# Patient Record
Sex: Female | Born: 1995 | Race: Black or African American | Hispanic: No | Marital: Single | Smoking: Never smoker
Health system: Southern US, Community
[De-identification: ages and names within clinical notes are randomized; demographics above are authoritative.]

---

## 2016-01-27 ENCOUNTER — Ambulatory Visit: Admission: EM | Admit: 2016-01-27 | Discharge: 2016-01-27 | Discharge status: Absent without leave | Payer: Self-pay

## 2016-02-27 ENCOUNTER — Encounter: Payer: Self-pay | Admitting: Emergency Medicine

## 2016-02-27 ENCOUNTER — Encounter: Payer: Self-pay | Admitting: Gynecology

## 2016-02-27 ENCOUNTER — Ambulatory Visit
Admission: EM | Admit: 2016-02-27 | Discharge: 2016-02-27 | Disposition: A | Discharge status: Home / Self Care | Payer: Self-pay | Attending: Family Medicine | Admitting: Family Medicine

## 2016-02-27 ENCOUNTER — Emergency Department
Admission: EM | Admit: 2016-02-27 | Discharge: 2016-02-27 | Disposition: A | Discharge status: Home / Self Care | Payer: Self-pay | Attending: Emergency Medicine | Admitting: Emergency Medicine

## 2016-02-27 ENCOUNTER — Emergency Department: Payer: Self-pay

## 2016-02-27 DIAGNOSIS — R55 Syncope and collapse: Secondary | ICD-10-CM | POA: Insufficient documentation

## 2016-02-27 DIAGNOSIS — R079 Chest pain, unspecified: Secondary | ICD-10-CM | POA: Insufficient documentation

## 2016-02-27 DIAGNOSIS — R109 Unspecified abdominal pain: Secondary | ICD-10-CM | POA: Insufficient documentation

## 2016-02-27 DIAGNOSIS — R1010 Upper abdominal pain, unspecified: Secondary | ICD-10-CM

## 2016-02-27 DIAGNOSIS — R0789 Other chest pain: Secondary | ICD-10-CM | POA: Insufficient documentation

## 2016-02-27 LAB — URINALYSIS, COMPLETE (UACMP) WITH MICROSCOPIC
BILIRUBIN URINE: NEGATIVE
Bacteria, UA: NONE SEEN
GLUCOSE, UA: NEGATIVE mg/dL
HGB URINE DIPSTICK: NEGATIVE
KETONES UR: NEGATIVE mg/dL
Nitrite: NEGATIVE
PH: 6 (ref 5.0–8.0)
PROTEIN: NEGATIVE mg/dL
Specific Gravity, Urine: 1.025 (ref 1.005–1.030)

## 2016-02-27 LAB — BASIC METABOLIC PANEL
ANION GAP: 10 (ref 5–15)
BUN: 11 mg/dL (ref 6–20)
CALCIUM: 10 mg/dL (ref 8.9–10.3)
CO2: 23 mmol/L (ref 22–32)
CREATININE: 0.78 mg/dL (ref 0.44–1.00)
Chloride: 105 mmol/L (ref 101–111)
GFR calc Af Amer: >60 mL/min (ref >60)
GLUCOSE: 78 mg/dL (ref 65–99)
Potassium: 3.7 mmol/L (ref 3.5–5.1)
Sodium: 138 mmol/L (ref 135–145)

## 2016-02-27 LAB — TROPONIN I

## 2016-02-27 LAB — CBC
HCT: 43.3 % (ref 35.0–47.0)
HEMOGLOBIN: 14 g/dL (ref 12.0–16.0)
MCH: 25.5 pg — AB (ref 26.0–34.0)
MCHC: 32.3 g/dL (ref 32.0–36.0)
MCV: 78.9 fL — ABNORMAL LOW (ref 80.0–100.0)
Platelets: 243 10*3/uL (ref 150–440)
RBC: 5.49 MIL/uL — ABNORMAL HIGH (ref 3.80–5.20)
RDW: 14.6 % — AB (ref 11.5–14.5)
WBC: 7.9 10*3/uL (ref 3.6–11.0)

## 2016-02-27 LAB — FIBRIN DERIVATIVES D-DIMER (ARMC ONLY): Fibrin derivatives D-dimer (ARMC): 188 (ref 0–499)

## 2016-02-27 NOTE — ED Notes (Signed)
Spoke to Leisure centre managerBrandy charge nurse at [facility] and gave her report.

## 2016-02-27 NOTE — Discharge Instructions (Signed)

## 2016-02-27 NOTE — ED Triage Notes (Signed)
Pt states chest pain has been present since Christmas, pt states she had a syncopal episode at work on Wednesday which she states she felt dizzy before passing out.  Pain is in the central chest radiating to the left side and also radiating to the back.  Pt states she gets short of breath at rest at times as well.  No recent extend travel.

## 2016-02-27 NOTE — ED Provider Notes (Signed)
[facility] Emergency Department Provider Note  ____________________________________________   First MD Initiated Contact with Patient 02/27/16 1826     (approximate)  I have reviewed the triage vital signs and the nursing notes.   HISTORY  Chief Complaint Chest Pain    HPI [name] is a 21 y.o. female who reports no chronic medical problems and presents for evaluation of pressure like chest pain that has been present for about 2 months.  She states that nothing in particular makes it better or worse but it is more noticeable and seems to be worse at night.  It is currently mild.  Sometimes she feels short of breath but not always.  She decided to come to the emergency department today because 3 days ago she had a syncopal episode while at work.  She states that she started feeling lightheaded and dizzy and got very hot all over and new she was going to pass out.  She did not sustain any injuries and she is not sure if she had chest pain at this time.  This is the first opportunity she has had to follow up since the syncopal episode.  She denies fever/chills nausea, vomiting, diarrhea, abdominal pain, dysuria.  She is not on any birth control pills or estrogen supplements.  She has not been on any long trips recently and has had no immobilizations.  There is no history, personal or family, of blood clots, MI, nor sudden cardiac death in otherwise young and healthy individuals.    She went to the urgent care earlier today and then was referred to the emergency department.  History reviewed. No pertinent past medical history.  There are no active problems to display for this patient.   History reviewed. No pertinent surgical history.  Prior to Admission medications   Not on File    Allergies Patient has no known allergies.  History reviewed. No pertinent family history.  Social History Social History  Substance Use Topics  . Smoking  status: Never Smoker  . Smokeless tobacco: Never Used  . Alcohol use No    Review of Systems Constitutional: No fever/chills Eyes: No visual changes. ENT: No sore throat. Cardiovascular: Pressure like chest pain for 2 months Respiratory: Denies shortness of breath. Gastrointestinal: No abdominal pain.  No nausea, no vomiting.  No diarrhea.  No constipation. Genitourinary: Negative for dysuria. Musculoskeletal: Negative for back pain. Skin: Negative for rash. Neurological: Negative for headaches, focal weakness or numbness.  10-point ROS otherwise negative.  ____________________________________________   PHYSICAL EXAM:  VITAL SIGNS: ED Triage Vitals  Enc Vitals Group     BP 02/27/16 1715 129/72     Pulse Rate 02/27/16 1715 80     Resp 02/27/16 1715 18     Temp 02/27/16 1715 98 F (36.7 C)     Temp Source 02/27/16 1715 Oral     SpO2 02/27/16 1715 100 %     Weight 02/27/16 1711 132 lb (59.9 kg)     Height 02/27/16 1711 5\' 6"  (1.676 m)     Head Circumference --      Peak Flow --      Pain Score 02/27/16 1712 7     Pain Loc --      Pain Edu? --      Excl. in GC? --     Constitutional: Alert and oriented. Well appearing and in no acute distress. Eyes: Conjunctivae are normal. PERRL. EOMI. Head: Atraumatic. Nose: No congestion/rhinnorhea. Mouth/Throat: Mucous  membranes are moist. Neck: No stridor.  No meningeal signs.   Cardiovascular: Normal rate, regular rhythm. Good peripheral circulation. Grossly normal heart sounds. Respiratory: Normal respiratory effort.  No retractions. Lungs CTAB. Gastrointestinal: Soft and nontender. No distention.  Musculoskeletal: No lower extremity tenderness nor edema. No gross deformities of extremities. Neurologic:  Normal speech and language. No gross focal neurologic deficits are appreciated.  Skin:  Skin is warm, dry and intact. No rash noted. Psychiatric: Mood and affect are normal. Speech and behavior are  normal.  ____________________________________________   LABS (all labs ordered are listed, but only abnormal results are displayed)  Labs Reviewed  CBC - Abnormal; Notable for the following:       Result Value   RBC 5.49 (*)    MCV 78.9 (*)    MCH 25.5 (*)    RDW 14.6 (*)    All other components within normal limits  BASIC METABOLIC PANEL  TROPONIN I  FIBRIN DERIVATIVES D-DIMER ([facility] ONLY)   ____________________________________________  EKG  ED ECG REPORT I, Luisdavid Hamblin, the attending physician, personally viewed and interpreted this ECG.  Date: 02/27/2016 EKG Time: 17:06 Rate: 87 Rhythm: normal sinus rhythm QRS Axis: normal Intervals: normal ST/T Wave abnormalities: normal Conduction Disturbances: none Narrative Interpretation: unremarkable  ____________________________________________  RADIOLOGY   Dg Chest 2 View  Result Date: 02/27/2016 CLINICAL DATA:  Chest pain EXAM: CHEST  2 VIEW COMPARISON:  None. FINDINGS: The heart size and mediastinal contours are within normal limits. Both lungs are clear. The visualized skeletal structures are unremarkable. IMPRESSION: No active cardiopulmonary disease. Electronically Signed   By: Gerome Sam III M.D   On: 02/27/2016 17:41    ____________________________________________   PROCEDURES  Procedure(s) performed:   Procedures   Critical Care performed: No ____________________________________________   INITIAL IMPRESSION / ASSESSMENT AND PLAN / ED COURSE  Pertinent labs & imaging results that were available during my care of the patient were reviewed by me and considered in my medical decision making (see chart for details).  Patient is well-appearing and in no acute distress with a normal EKG.  Her troponin and CBC and BMP are all within normal limits as is her chest x-ray.  There is no indication of hypertrophic cardiomyopathy based on her history nor her EKG.  She is low risk for PE but I do not have a  better explanation for the constellation of symptoms including persistent chest pain, intermittent shortness of breath, and even a syncopal episode although the syncope sounded more orthostatic or vasovagal than cardiogenic.  Since she is low risk, I will obtain a d-dimer to try and rule out PE.  If it is not within normal limits I will obtain a CT scan of her chest.  I discussed all this with the patient and she understands and agrees with the plan.   Clinical Course as of Feb 27 2040  Sat Feb 27, 2016  4782 D-dimer is within normal limits.  I will update the patient and let her know that her workup was reassuring with no evidence of acute or emergent cause of her ongoing chest pain for 2 months.  I will give her the name and number with cardiologist whom she can follow-up. I gave my usual and customary return precautions.  Fibrin derivatives D-dimer [facility]): 188 [CF]    Clinical Course User Index [CF] Loleta Rose, MD    ____________________________________________  FINAL CLINICAL IMPRESSION(S) / ED DIAGNOSES  Final diagnoses:  Atypical chest pain  MEDICATIONS GIVEN DURING THIS VISIT:  Medications - No data to display   NEW OUTPATIENT MEDICATIONS STARTED DURING THIS VISIT:  New Prescriptions   No medications on file    Modified Medications   No medications on file    Discontinued Medications   No medications on file     Note:  This document was prepared using Dragon voice recognition software and may include unintentional dictation errors.    Loleta Roseory Susette Seminara, MD 02/27/16 2042

## 2016-02-27 NOTE — ED Triage Notes (Signed)
Per patient x couple weeks chest pain rotating to her back. Patient also complain of lower abdominal pain. Per patient pass out at work x 5 days ago.

## 2016-02-27 NOTE — ED Notes (Signed)
NAD noted at this time. Pt resting in bed with eyes open, respirations even and unlabored, skin warm, dry, and intact. Pt requesting something to eat, this RN explained would have to notify MD. Pt states understanding.

## 2016-02-27 NOTE — ED Notes (Signed)
Pt visualized in NAD at this time. PT resting in bed. Denies any needs to this RN at this time. Pt skin noted to be warm, dry, and intact, respirations even and unlabored at this time. Will continue to monitor for further patient needs.

## 2016-02-27 NOTE — ED Provider Notes (Signed)
[facility]    CSN: [id number] Arrival date & time: 02/27/16  1438     History   Chief Complaint Chief Complaint  Patient presents with  . Chest Pain  . Abdominal Pain    HPI Kristin Hines is a 21 y.o. female.   Patient is a 21 year old black female who only for the last 3 weeks been having chest pain. She states that along with having chest pain she is also having abdominal pain the pains moving to the back as well. Along with having the chest pain abdominal pain apparently 5 days ago she was having chest pain and she was at work and passed out. The syncopal episode was witnessed by the people at work. She thought it was just from her not eating but she did admit that she was having some chest pain at the time and chest pain now has gotten worse so she came in to be seen and evaluated. History diabetes hypertension or other medical problems or surgical history. She's never smoked she has no drug allergies and is no family medical history is pertinent to today's visit.   The history is provided by the patient.  Chest Pain  Pain location:  Substernal area and L chest Pain quality: burning, pressure and shooting   Pain radiates to:  Mid back Pain severity:  Moderate Duration:  4 weeks Timing:  Constant Progression:  Worsening Chronicity:  New Relieved by:  Nothing Worsened by:  Nothing Ineffective treatments:  None tried Associated symptoms: abdominal pain   Associated symptoms: no shortness of breath   Risk factors: no coronary artery disease, no diabetes mellitus, no hypertension and no smoking   Abdominal Pain  Associated symptoms: chest pain   Associated symptoms: no shortness of breath     History reviewed. No pertinent past medical history.  There are no active problems to display for this patient.   History reviewed. No pertinent surgical history.  OB History    No data available       Home Medications    Prior to Admission  medications   Not on File    Family History No family history on file.  Social History Social History  Substance Use Topics  . Smoking status: Never Smoker  . Smokeless tobacco: Never Used  . Alcohol use No     Allergies   Patient has no allergy information on record.   Review of Systems Review of Systems  Respiratory: Negative for shortness of breath.   Cardiovascular: Positive for chest pain.  Gastrointestinal: Positive for abdominal pain.  Neurological: Positive for syncope.     Physical Exam Triage Vital Signs ED Triage Vitals  Enc Vitals Group     BP 02/27/16 1519 138/77     Pulse Rate 02/27/16 1519 (!) 102     Resp 02/27/16 1519 16     Temp 02/27/16 1519 98.8 F (37.1 C)     Temp Source 02/27/16 1519 Oral     SpO2 02/27/16 1519 100 %     Weight 02/27/16 1522 132 lb (59.9 kg)     Height 02/27/16 1522 5\' 6"  (1.676 m)     Head Circumference --      Peak Flow --      Pain Score 02/27/16 1525 8     Pain Loc --      Pain Edu? --      Excl. in GC? --    No data found.   Updated Vital Signs  BP 138/77 (BP Location: Right Arm)   Pulse (!) 102   Temp 98.8 F (37.1 C) (Oral)   Resp 16   Ht 5\' 6"  (1.676 m)   Wt 132 lb (59.9 kg)   LMP 02/15/2016   SpO2 100%   BMI 21.31 kg/m   Visual Acuity Right Eye Distance:   Left Eye Distance:   Bilateral Distance:    Right Eye Near:   Left Eye Near:    Bilateral Near:     Physical Exam  Constitutional: She is oriented to person, place, and time. She appears well-developed and well-nourished.  HENT:  Head: Normocephalic and atraumatic.  Right Ear: External ear normal.  Left Ear: External ear normal.  Eyes: Pupils are equal, round, and reactive to light.  Neck: Normal range of motion. Neck supple. No tracheal deviation present.  Cardiovascular: Normal rate and regular rhythm.   Pulmonary/Chest: Effort normal and breath sounds normal.  Musculoskeletal: Normal range of motion.  Neurological: She is alert  and oriented to person, place, and time.  Skin: Skin is warm.  Psychiatric: She has a normal mood and affect.  Vitals reviewed.    UC Treatments / Results  Labs (all labs ordered are listed, but only abnormal results are displayed) Labs Reviewed  URINALYSIS, COMPLETE (UACMP) WITH MICROSCOPIC - Abnormal; Notable for the following:       Result Value   Leukocytes, UA TRACE (*)    Squamous Epithelial / LPF 0-5 (*)    All other components within normal limits    EKG  EKG Interpretation None     ED ECG REPORT I, Jarick Harkins H, the attending physician, personally viewed and interpreted this ECG.   Date: 02/27/2016  EKG Time: 15:46:35  Rate: 91  Rhythm: normal EKG, normal sinus rhythm  Axis: 59  Intervals:none  ST&T Change:  none Radiology No results found.  Procedures Procedures (including critical care time)  Medications Ordered in UC Medications - No data to display   Initial Impression / Assessment and Plan / UC Course  I have reviewed the triage vital signs and the nursing notes.  Pertinent labs & imaging results that were available during my care of the patient were reviewed by me and considered in my medical decision making (see chart for details).   patient with abdominal pain chest pain in the chest pain seems be very most pain and chest and abdominal pain also some nonspecific the concerning factor though is 5 days ago having a syncopal episode. She cannot clearly state that this was not related when she was having chest pain in the chest pain has gotten worse. With multiple 2 of problems that she's having I think this best time transit the ED [facility]. Then if she needs to have an echo or CT scan of the head and chest were appendectomy done.    Final Clinical Impressions(s) / UC Diagnoses   Final diagnoses:  Pain of upper abdomen  Chest pain, unspecified type  Syncope, unspecified syncope type    New  Prescriptions There are no discharge medications for this patient.  Note: This dictation was prepared with Dragon dictation along with smaller phrase technology. Any transcriptional errors that result from this process are unintentional.   Hassan RowanEugene Devon Pretty, MD 02/27/16 1655

## 2016-02-27 NOTE — ED Notes (Signed)
NAD noted at time of D/C. Pt denies questions or concerns. Pt ambulatory to the lobby at this time.  

## 2017-03-30 ENCOUNTER — Other Ambulatory Visit: Payer: Self-pay

## 2017-03-30 ENCOUNTER — Emergency Department: Payer: Self-pay

## 2017-03-30 DIAGNOSIS — R51 Headache: Secondary | ICD-10-CM | POA: Insufficient documentation

## 2017-03-30 DIAGNOSIS — R079 Chest pain, unspecified: Secondary | ICD-10-CM | POA: Insufficient documentation

## 2017-03-30 DIAGNOSIS — Z5321 Procedure and treatment not carried out due to patient leaving prior to being seen by health care provider: Secondary | ICD-10-CM | POA: Insufficient documentation

## 2017-03-30 LAB — CBC
HEMATOCRIT: 39.5 % (ref 35.0–47.0)
Hemoglobin: 12.8 g/dL (ref 12.0–16.0)
MCH: 26.1 pg (ref 26.0–34.0)
MCHC: 32.6 g/dL (ref 32.0–36.0)
MCV: 80.2 fL (ref 80.0–100.0)
Platelets: 238 10*3/uL (ref 150–440)
RBC: 4.92 MIL/uL (ref 3.80–5.20)
RDW: 14.8 % — AB (ref 11.5–14.5)
WBC: 8.3 10*3/uL (ref 3.6–11.0)

## 2017-03-30 NOTE — ED Triage Notes (Signed)
Patient ambulatory to triage with steady gait, without difficulty or distress noted; pt reports last few months having intermittent mid CP, nonradiating; also c/o temporal HA x 2 days

## 2017-03-31 ENCOUNTER — Telehealth: Payer: Self-pay | Admitting: Emergency Medicine

## 2017-03-31 ENCOUNTER — Emergency Department
Admission: EM | Admit: 2017-03-31 | Discharge: 2017-03-31 | Disposition: A | Discharge status: Home / Self Care | Payer: Self-pay | Attending: Emergency Medicine | Admitting: Emergency Medicine

## 2017-03-31 LAB — TROPONIN I: Troponin I: <0.03 ng/mL (ref <0.03)

## 2017-03-31 LAB — BASIC METABOLIC PANEL
Anion gap: 9 (ref 5–15)
BUN: 17 mg/dL (ref 6–20)
CO2: 24 mmol/L (ref 22–32)
Calcium: 9.1 mg/dL (ref 8.9–10.3)
Chloride: 105 mmol/L (ref 101–111)
Creatinine, Ser: 0.67 mg/dL (ref 0.44–1.00)
GFR calc Af Amer: >60 mL/min (ref >60)
Glucose, Bld: 110 mg/dL — ABNORMAL HIGH (ref 65–99)
POTASSIUM: 3.6 mmol/L (ref 3.5–5.1)
SODIUM: 138 mmol/L (ref 135–145)

## 2017-03-31 NOTE — ED Notes (Signed)
Pt left w/o being seen

## 2017-03-31 NOTE — Telephone Encounter (Signed)
Called patient due to lwot to inquire about condition and follow up plans.no answer and no voicemail  

## 2017-08-07 LAB — HM PAP SMEAR: HM PAP: NEGATIVE

## 2017-09-20 IMAGING — CR DG CHEST 2V
1 series · 2 of 2 positions shown · non-contrast
Comparison: None.

CLINICAL DATA: Chest pain

EXAM:
CHEST  2 VIEW

[Series 1: dg chest 2 view · 0.14mm/px · 2 of 2 slices shown]
[im 1/2]
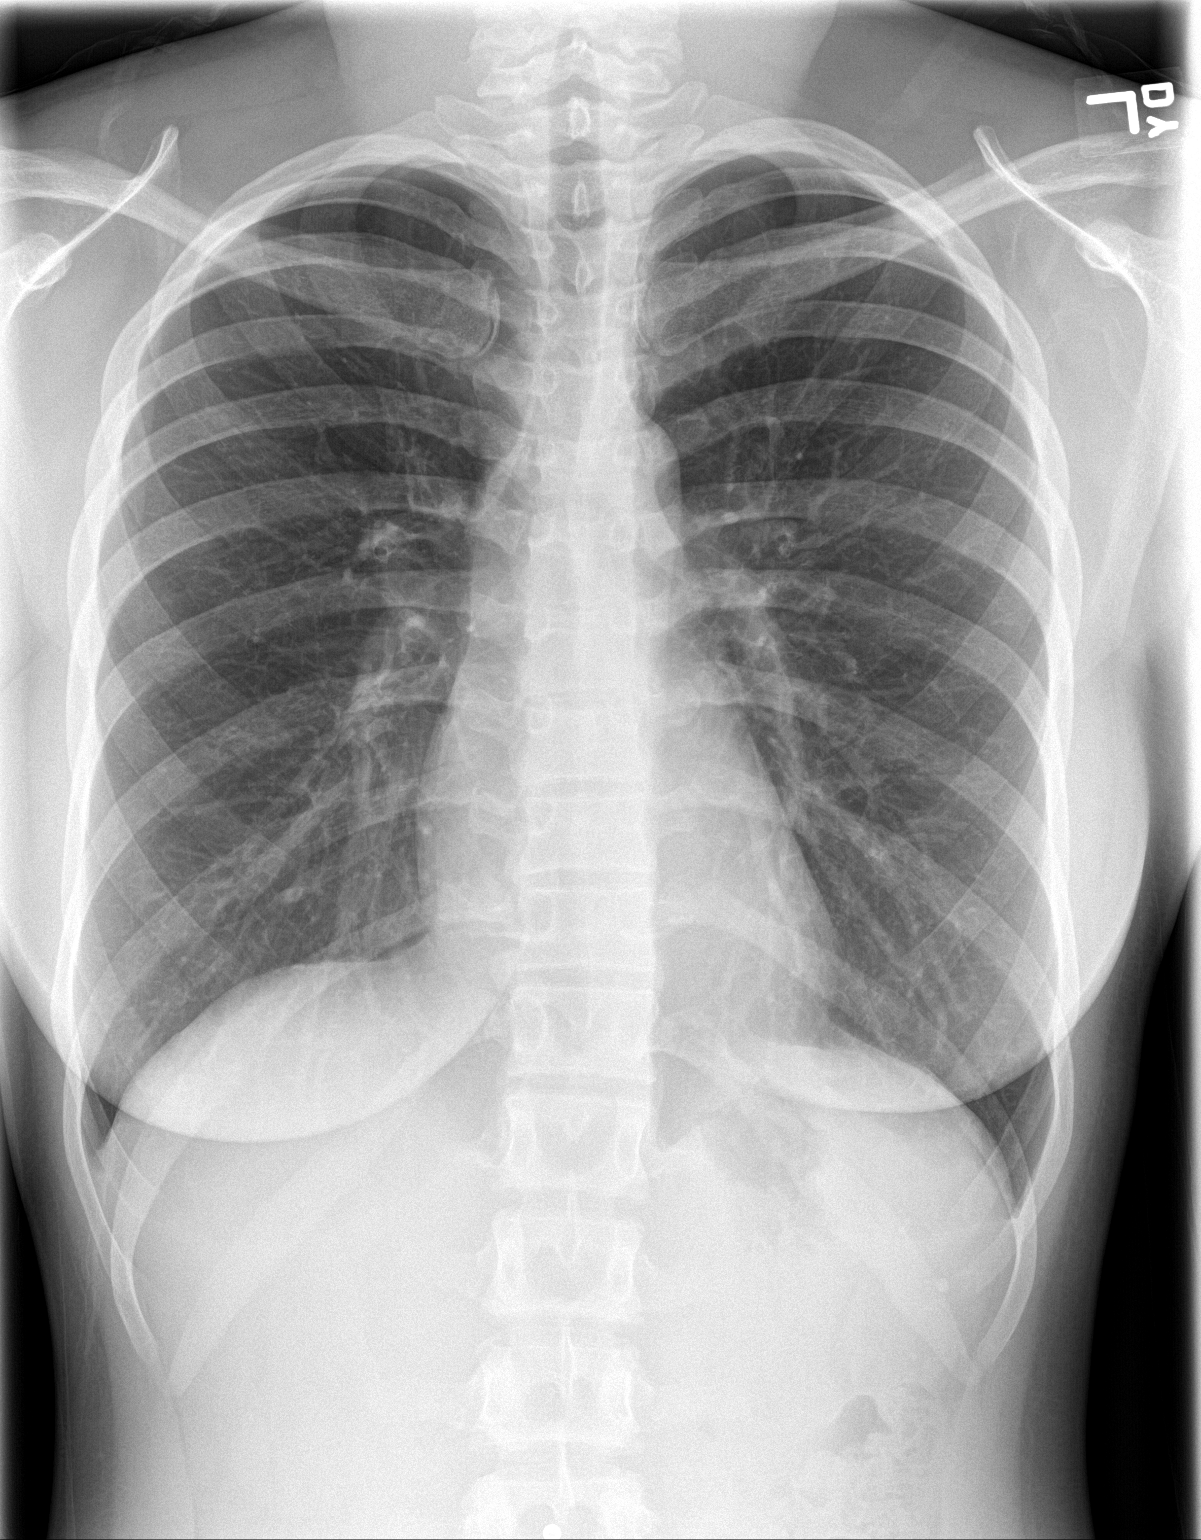
[im 2/2]
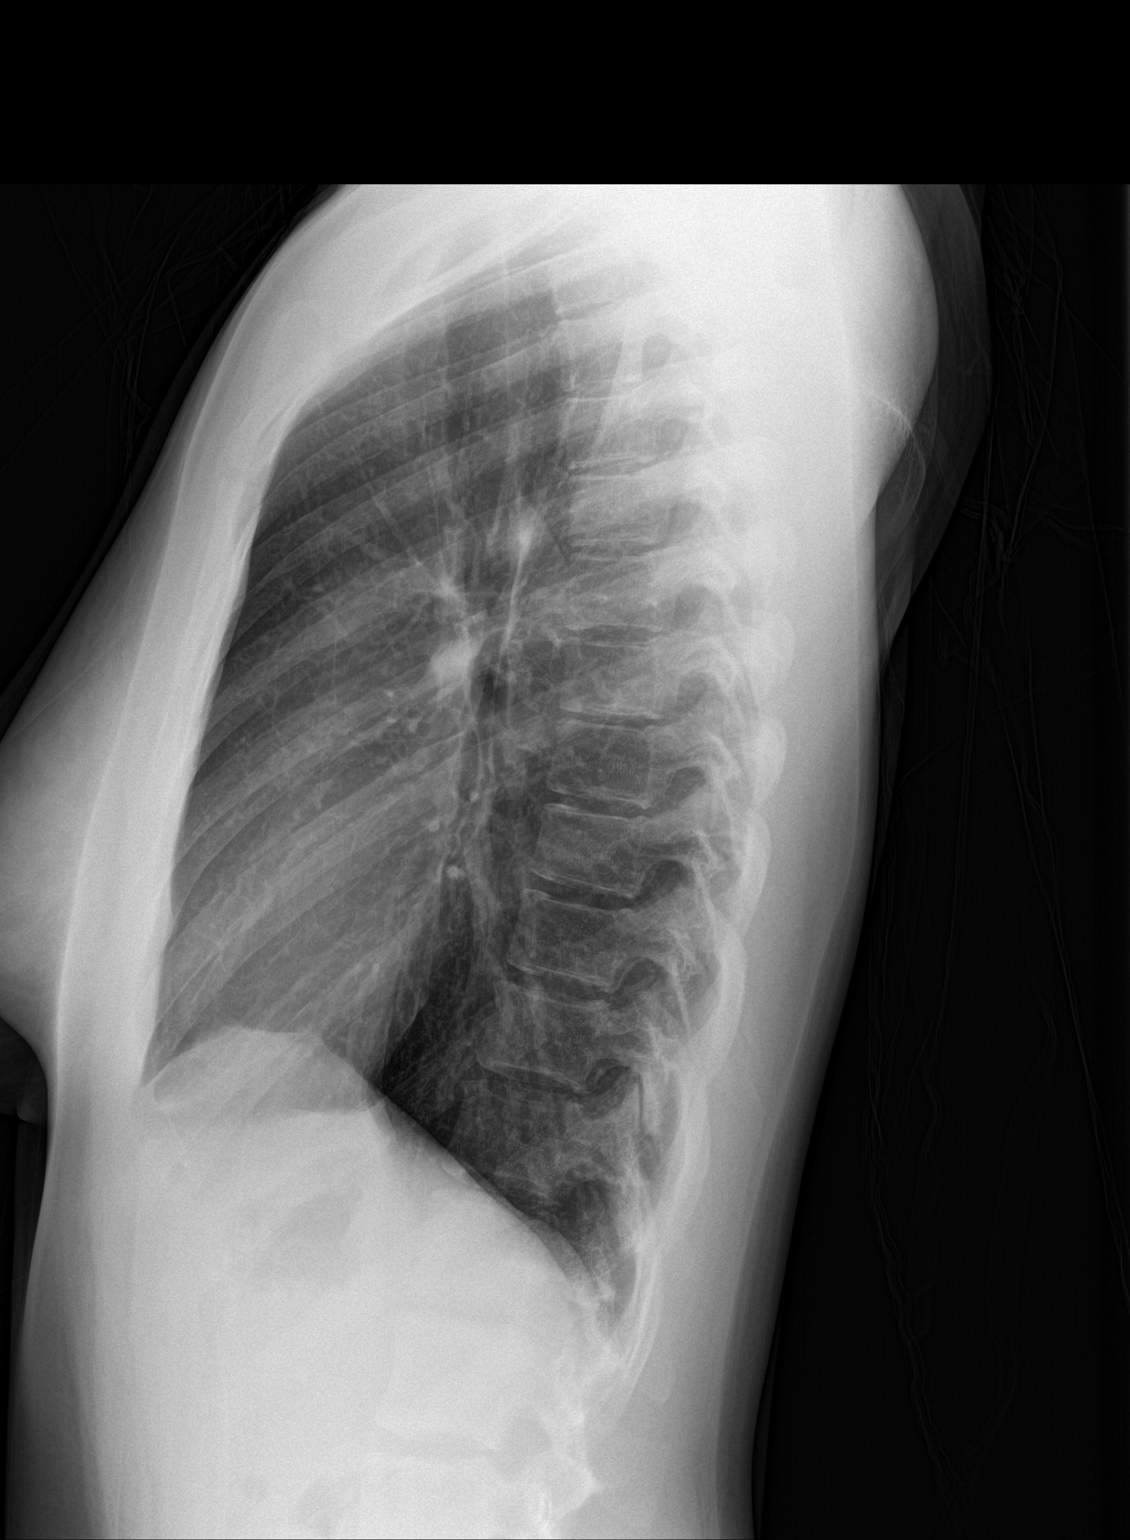

[2 of 2 positions shown; findings below may reference images not displayed]

FINDINGS: The heart size and mediastinal contours are within normal limits.
Both lungs are clear. The visualized skeletal structures are
unremarkable.
IMPRESSION: No active cardiopulmonary disease.

## 2017-12-13 LAB — HIV ANTIBODY (ROUTINE TESTING W REFLEX): HIV: NONREACTIVE

## 2020-04-22 ENCOUNTER — Ambulatory Visit: Payer: Medicaid Other

## 2020-04-27 ENCOUNTER — Ambulatory Visit: Payer: Medicaid Other

## 2021-07-09 ENCOUNTER — Encounter: Payer: Self-pay | Admitting: Nurse Practitioner

## 2021-07-09 ENCOUNTER — Ambulatory Visit: Payer: Medicaid Other | Admitting: Nurse Practitioner

## 2021-07-09 DIAGNOSIS — Z113 Encounter for screening for infections with a predominantly sexual mode of transmission: Secondary | ICD-10-CM | POA: Diagnosis not present

## 2021-07-09 DIAGNOSIS — F419 Anxiety disorder, unspecified: Secondary | ICD-10-CM

## 2021-07-09 DIAGNOSIS — Z3200 Encounter for pregnancy test, result unknown: Secondary | ICD-10-CM

## 2021-07-09 LAB — HM HIV SCREENING LAB: HM HIV Screening: NEGATIVE

## 2021-07-09 NOTE — Progress Notes (Signed)
[facility] Department  STI clinic/screening visit [address] [place] [address] [phone]  Subjective:  Kristin Hines is a 26 y.o. female being seen today for an STI screening visit. The patient reports they do not have symptoms.  Patient reports that they do not desire a pregnancy in the next year.   They reported they are not interested in discussing contraception today.    Patient's last menstrual period was 06/03/2021 (exact date).   Patient has the following medical conditions:  There are no problems to display for this patient.   Chief Complaint  Patient presents with   SEXUALLY TRANSMITTED DISEASE    Screening    HPI  Patient reports to clinic today for an STD screening.  Patient reports that 2 weeks ago she had some vaginal itching and went to the store and purchased Monistat, symptoms have cleared but would like a routine screening.   Last HIV test per patient/review of record was 2019. Patient reports last pap was 07/2017.   Screening for MPX risk: Does the patient have an unexplained rash? No Is the patient MSM? No Does the patient endorse multiple sex partners or anonymous sex partners? No Did the patient have close or sexual contact with a person diagnosed with MPX? No Has the patient traveled outside the [place] where MPX is endemic? No Is there a high clinical suspicion for MPX-- evidenced by one of the following No  -Unlikely to be chickenpox  -Lymphadenopathy  -Rash that present in same phase of evolution on any given body part See flowsheet for further details and programmatic requirements.   Immunization history:  Immunization History  Administered Date(s) Administered   Tdap 12/13/2017     The following portions of the patient's history were reviewed and updated as appropriate: allergies, current medications, past medical history, past social history, past surgical history and problem list.  Objective:  There were no  vitals filed for this visit.  Physical Exam Constitutional:      Appearance: Normal appearance.  HENT:     Head: Normocephalic. No abrasion, masses or laceration. Hair is normal.     Mouth/Throat:     Mouth: No oral lesions.     Dentition: No dental caries.     Pharynx: No oropharyngeal exudate or posterior oropharyngeal erythema.     Tonsils: No tonsillar exudate or tonsillar abscesses.  Eyes:     General: Lids are normal.        Right eye: No discharge.        Left eye: No discharge.     Conjunctiva/sclera: Conjunctivae normal.     Right eye: No exudate.    Left eye: No exudate. Abdominal:     General: Abdomen is flat.     Palpations: Abdomen is soft.     Tenderness: There is no abdominal tenderness. There is no rebound.  Genitourinary:    Comments: Deferred, patient self collected  Musculoskeletal:     Cervical back: Full passive range of motion without pain, normal range of motion and neck supple.  Lymphadenopathy:     Cervical: No cervical adenopathy.     Right cervical: No superficial, deep or posterior cervical adenopathy.    Left cervical: No superficial, deep or posterior cervical adenopathy.     Upper Body:     Right upper body: No supraclavicular, axillary or epitrochlear adenopathy.     Left upper body: No supraclavicular, axillary or epitrochlear adenopathy.  Skin:    General: Skin  is warm and dry.     Findings: No lesion or rash.  Neurological:     Mental Status: She is alert and oriented to person, place, and time.  Psychiatric:        Attention and Perception: Attention normal.        Mood and Affect: Mood normal.        Speech: Speech normal.        Behavior: Behavior normal. Behavior is cooperative.      Assessment and Plan:  Kristin Hines is a 26 y.o. female presenting to the [facility] Department for STI screening  1. Screening for venereal disease -26 year old female in clinic today for STD screening. -Patient accepted all  screenings including vaginal CT/GC, wet prep and bloodwork for HIV/RPR.  Patient meets criteria for HepB screening? No. Ordered? No - low risk  Patient meets criteria for HepC screening? No. Ordered? No-low risk   Treat wet prep per standing order Discussed time line for [facility] results and that patient will be called with positive results and encouraged patient to call if she had not heard in 2 weeks.  Counseled to return or seek care for continued or worsening symptoms Recommended condom use with all sex  Patient is currently not using  contraception  to prevent pregnancy.    - Chlamydia/Gonorrhea [facility] - HIV [facility] - WET PREP FOR TRICH, YEAST, CLUE - Syphilis Serology, [facility]  2. Encounter for pregnancy test, result unknown -Patient desires a pregnancy test.  - Pregnancy, urine  3. Anxiety -Patient reports a history of anxiety.  Counseling referral submitted.   - Ambulatory referral to [facility]     Return if symptoms worsen or fail to improve.    Glenna Fellows, FNP

## 2021-07-10 LAB — PREGNANCY, URINE: Preg Test, Ur: NEGATIVE

## 2021-07-10 LAB — WET PREP FOR TRICH, YEAST, CLUE
Trichomonas Exam: NEGATIVE
Yeast Exam: NEGATIVE

## 2021-07-14 ENCOUNTER — Ambulatory Visit: Payer: Medicaid Other | Admitting: Licensed Clinical Social Worker

## 2021-07-15 ENCOUNTER — Ambulatory Visit: Payer: Medicaid Other | Admitting: Licensed Clinical Social Worker

## 2021-07-15 DIAGNOSIS — F4310 Post-traumatic stress disorder, unspecified: Secondary | ICD-10-CM | POA: Insufficient documentation
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
## 2021-07-29 ENCOUNTER — Ambulatory Visit: Payer: Medicaid Other | Admitting: Licensed Clinical Social Worker

## 2022-06-30 ENCOUNTER — Ambulatory Visit: Payer: Medicaid Other | Admitting: Advanced Practice Midwife

## 2022-06-30 ENCOUNTER — Encounter: Payer: Self-pay | Admitting: Advanced Practice Midwife

## 2022-06-30 VITALS — BP 113/75 | Ht 67.0 in | Wt 192.0 lb

## 2022-06-30 DIAGNOSIS — Z309 Encounter for contraceptive management, unspecified: Secondary | ICD-10-CM | POA: Diagnosis not present

## 2022-06-30 DIAGNOSIS — Z30013 Encounter for initial prescription of injectable contraceptive: Secondary | ICD-10-CM

## 2022-06-30 DIAGNOSIS — E669 Obesity, unspecified: Secondary | ICD-10-CM | POA: Insufficient documentation

## 2022-06-30 DIAGNOSIS — Z3009 Encounter for other general counseling and advice on contraception: Secondary | ICD-10-CM

## 2022-06-30 DIAGNOSIS — Z3202 Encounter for pregnancy test, result negative: Secondary | ICD-10-CM

## 2022-06-30 LAB — WET PREP FOR TRICH, YEAST, CLUE
Trichomonas Exam: NEGATIVE
Yeast Exam: NEGATIVE

## 2022-06-30 LAB — PREGNANCY, URINE: Preg Test, Ur: NEGATIVE

## 2022-06-30 MED ORDER — MEDROXYPROGESTERONE ACETATE 150 MG/ML IM SUSP
150.0000 mg | INTRAMUSCULAR | Status: AC
Start: 2022-06-30 — End: 2023-06-25
  Administered 2022-06-30: 150 mg via INTRAMUSCULAR

## 2022-06-30 NOTE — Progress Notes (Signed)
Pt here for family planning visit. Wet prep results reviewed with pt, no treatment required per standing orders.  Family planning reviewed and sent home with pt.  Pt given depo in R. Deltoid given reminder card for next depo due 09/15/2022.

## 2022-06-30 NOTE — Progress Notes (Signed)
[facility] DEPARTMENT [facility] [address] Main Number: [phone]   Family Planning Visit- Initial Visit  Subjective:  RUSHDA RITZ is a 27 y.o. SBF nonsmoker G1P1 (85 yo daughter)  being seen today for an initial annual visit and to discuss reproductive life planning.  The patient is currently using No Method - Other Reason for pregnancy prevention. Patient reports   does not want a pregnancy in the next year.     report they are looking for a method that provides High efficacy at preventing pregnancy  Patient has the following medical conditions has PTSD (post-traumatic stress disorder) and Obesity BMI=30 on their problem list.  Chief Complaint  Patient presents with   Annual Exam    Patient reports here for physical, pap, birth control. LMP 06/06/22 brown. Last sex 04/27/22 without condom; 1 partner in last 3 mo. Wants BTL and DMPA. Last dental exam 06/05/22. Last ETOH 03/05/22 (1 Margarita). 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 Living with daughter and FOB. Last pap 08/07/17 neg.  Patient denies cigs, vaping, cigars, MJ  Body mass index is 30.07 kg/m. - Patient is eligible for diabetes screening based on BMI> 25 and age >35?  not applicable HA1C ordered? not applicable  Patient reports 1  partner/s in last year. Desires STI screening?  No - declines bloodwork  Has patient been screened once for HCV in the past?  Yes  No results found for: "HCVAB"  Does the patient have current drug use (including MJ), have a partner with drug use, and/or has been incarcerated since last result? No  If yes-- Screen for HCV through [facility]   Does the patient meet criteria for HBV testing? No  Criteria:  -Household, sexual or needle sharing contact with HBV -History of drug use -HIV positive -Those with known Hep C   Health Maintenance Due  Topic Date Due   COVID-19 Vaccine (1) Never done   HPV VACCINES (1 - 2-dose  series) Never done   Hepatitis C Screening  Never done   PAP-Cervical Cytology Screening  08/07/2020   PAP SMEAR-Modifier  08/07/2020    Review of Systems  Respiratory:  Positive for shortness of breath (onset 1 year ago; has been to ER, cardio, GI; ECHO wnl, daily intermittently under left breast x few minutes; referred to primary care MDt).   Gastrointestinal:  Positive for nausea (without vomiting 04/2022 with brown short menses).  Neurological:  Positive for headaches (3x/wk over forehead relieved with standing straight or stretching).    The following portions of the patient's history were reviewed and updated as appropriate: allergies, current medications, past family history, past medical history, past social history, past surgical history and problem list. Problem list updated.   See flowsheet for other program required questions.  Objective:   Vitals:   06/30/22 1315  BP: 113/75  Weight: 192 lb (87.1 kg)  Height: 5\' 7"  (1.702 m)    Physical Exam Constitutional:      Appearance: Normal appearance. She is obese.  HENT:     Head: Normocephalic and atraumatic.     Comments: Last dental exam 06/05/22    Mouth/Throat:     Mouth: Mucous membranes are moist.  Eyes:     Conjunctiva/sclera: Conjunctivae normal.  Neck:     Thyroid: No thyroid mass, thyromegaly or thyroid tenderness.  Cardiovascular:     Rate and Rhythm: Normal rate and regular rhythm.  Pulmonary:  Effort: Pulmonary effort is normal.     Breath sounds: Normal breath sounds.  Chest:  Breasts:    Right: Normal.     Left: Normal.  Abdominal:     Palpations: Abdomen is soft.     Comments: Soft without masses or tenderness  Genitourinary:    General: Normal vulva.     Exam position: Lithotomy position.     Vagina: Vaginal discharge (white creamy leukorrhea, ph<4.5) present.     Cervix: Normal.     Uterus: Normal.      Adnexa: Right adnexa normal and left adnexa normal.     Rectum: Normal.      Comments: Pap done Musculoskeletal:        General: Normal range of motion.     Cervical back: Normal range of motion and neck supple.  Skin:    General: Skin is warm and dry.  Neurological:     Mental Status: She is alert.  Psychiatric:        Mood and Affect: Mood normal.      Assessment and Plan:  EMMIE FRAKES is a 27 y.o. female presenting to the [facility] Department for an initial annual wellness/contraceptive visit  Contraception counseling: Reviewed options based on patient desire and reproductive life plan. Patient is interested in Hormonal Injection. This was provided to the patient today.  if not why not clearly documented  Risks, benefits, and typical effectiveness rates were reviewed.  Questions were answered.  Written information was also given to the patient to review.    The patient will follow up in  11-13 weeks for surveillance.  The patient was told to call with any further questions, or with any concerns about this method of contraception.  Emphasized use of condoms 100% of the time for STI prevention.  Educated on ECP and assessed for need of ECP. Patient reported not meeting criteria.  Reviewed options and patient desired No method of ECP, declined all    1. Family planning Treat wet mount per standing orders Immunization nurse consult  - WET PREP FOR TRICH, YEAST, CLUE - Pregnancy, urine - Chlamydia/Gonorrhea [place] [facility] - IGP, rfx Aptima HPV ASCU - medroxyPROGESTERone (DEPO-PROVERA) injection 150 mg  2. Obesity, unspecified classification, unspecified obesity type, unspecified whether serious comorbidity present   3. Encounter for initial prescription of injectable contraceptive If PT neg today pt desires DMPA 150 mg IM q 11-13 wks x 1 year Please counsel on need for abstinance next 7 days  No follow-ups on file.  No future appointments.  Alberteen Spindle, CNM

## 2022-07-05 LAB — IGP, RFX APTIMA HPV ASCU: PAP Smear Comment: 0

## 2023-01-05 ENCOUNTER — Ambulatory Visit: Payer: Medicaid Other | Admitting: Nurse Practitioner

## 2023-01-05 ENCOUNTER — Encounter: Payer: Self-pay | Admitting: Nurse Practitioner

## 2023-01-05 DIAGNOSIS — Z113 Encounter for screening for infections with a predominantly sexual mode of transmission: Secondary | ICD-10-CM | POA: Diagnosis not present

## 2023-01-05 LAB — WET PREP FOR TRICH, YEAST, CLUE
Trichomonas Exam: NEGATIVE
Yeast Exam: NEGATIVE

## 2023-01-05 NOTE — Progress Notes (Signed)
 Pt is here for STD screening , Wet prep results reviewed with pt, no treatment required per standing order. Condoms declined.Sonda Primes, RN.

## 2023-01-05 NOTE — Progress Notes (Cosign Needed)
[facility] Department  STI clinic/screening visit [address] [place] [address] [phone]  Subjective:  Kristin Hines is a 27 y.o. female being seen today for an STI screening visit. The patient reports they do not have symptoms.  Patient reports that they do not desire a pregnancy in the next year.   They reported they are not interested in discussing contraception today.    Patient's last menstrual period was 12/26/2022 (exact date).  Patient has the following medical conditions:   Patient Active Problem List   Diagnosis Date Noted   Obesity BMI=30 06/30/2022   PTSD (post-traumatic stress disorder) 07/15/2021    Chief Complaint  Patient presents with   SEXUALLY TRANSMITTED DISEASE    Pt is here STD screening and has symptoms    Patient presents today with concern of previous vaginal symptoms that have resolved since menstruating on 12/26/22. Symptoms presented around 12/12/22 and include-vaginal itching and irritation, dysuria, increased thick, brown, non-odorous vaginal discharge.Patient indicates female partners, condom use sometimes, vaginal only sex, and last sexual encounter was 09/2022. Patient reports a history of trich from 2018.    Does the patient using douching products? No  Last HIV test per patient/review of record was  Lab Results  Component Value Date   HMHIVSCREEN Negative - Validated 07/09/2021    Lab Results  Component Value Date   HIV Non reactive 12/13/2017     Last HEPC test per patient/review of record was No results found for: "HMHEPCSCREEN" No components found for: "HEPC"   Last HEPB test per patient/review of record was No components found for: "HMHEPBSCREEN" No components found for: "HEPC"   Patient reports last pap was:  Lab Results  Component Value Date   SPECADGYN Comment 06/30/2022   Result Date Procedure Results Follow-ups  06/30/2022 IGP, rfx Aptima HPV ASCU DIAGNOSIS:: Comment Specimen adequacy::  Comment Clinician Provided ICD10: Comment Performed by:: Comment PAP Smear Comment: . Note:: Comment Test Methodology: Comment PAP Reflex: Comment   08/07/2017 HM PAP SMEAR HM Pap smear: Neg     Screening for MPX risk: Does the patient have an unexplained rash? No Is the patient MSM? No Does the patient endorse multiple sex partners or anonymous sex partners? No Did the patient have close or sexual contact with a person diagnosed with MPX? No Has the patient traveled outside the [place] where MPX is endemic? No Is there a high clinical suspicion for MPX-- evidenced by one of the following No  -Unlikely to be chickenpox  -Lymphadenopathy  -Rash that present in same phase of evolution on any given body part See flowsheet for further details and programmatic requirements.   Immunization history:  Immunization History  Administered Date(s) Administered   Tdap 12/13/2017     The following portions of the patient's history were reviewed and updated as appropriate: allergies, current medications, past medical history, past social history, past surgical history and problem list.  Objective:  There were no vitals filed for this visit.  Physical Exam Nursing note reviewed.  Constitutional:      Appearance: Normal appearance.  HENT:     Head: Normocephalic.     Salivary Glands: Right salivary gland is not diffusely enlarged or tender. Left salivary gland is not diffusely enlarged or tender.     Mouth/Throat:     Lips: Pink. No lesions.     Mouth: Mucous membranes are moist.     Tongue: No lesions. Tongue does not deviate from midline.  Pharynx: Oropharynx is clear. Uvula midline. No oropharyngeal exudate or posterior oropharyngeal erythema.     Tonsils: No tonsillar exudate.  Eyes:     General:        Right eye: No discharge.        Left eye: No discharge.  Pulmonary:     Effort: Pulmonary effort is normal.  Genitourinary:    Comments: No current symptoms. Self-swabbed.   Lymphadenopathy:     Head:     Right side of head: No submental, submandibular, tonsillar, preauricular or posterior auricular adenopathy.     Left side of head: No submental, submandibular, tonsillar, preauricular or posterior auricular adenopathy.     Cervical: No cervical adenopathy.     Right cervical: No superficial or posterior cervical adenopathy.    Left cervical: No superficial or posterior cervical adenopathy.     Upper Body:     Right upper body: No supraclavicular or axillary adenopathy.     Left upper body: No supraclavicular or axillary adenopathy.  Skin:    General: Skin is warm and dry.     Comments: Skin tone appropriate for ethnicity.   Neurological:     Mental Status: She is alert and oriented to person, place, and time.  Psychiatric:        Attention and Perception: Attention normal.        Mood and Affect: Mood and affect normal.        Speech: Speech normal.        Behavior: Behavior normal. Behavior is cooperative.        Thought Content: Thought content normal.    Assessment and Plan:  Kristin Hines is a 27 y.o. female presenting to the [facility] Department for STI screening  1. Screening for venereal disease (Primary)  - Chlamydia/Gonorrhea [place] - WET PREP FOR TRICH, YEAST, CLUE  Patient accepted all screenings including vaginal CT/GC and wet prep. Patient meets criteria for HepB screening? No. Ordered? not applicable Patient meets criteria for HepC screening? No. Ordered? not applicable  Treat wet prep per standing order Discussed time line for State Lab results and that patient will be called with positive results and encouraged patient to call if she had not heard in 2 weeks.  Counseled to return or seek care for continued or worsening symptoms Recommended repeat testing in 3 months with positive results. Recommended condom use with all sex  Patient is currently using  abstinence  to prevent pregnancy.    Return if  symptoms worsen or fail to improve.  No future appointments.  Edmonia James, NP

## 2023-02-15 ENCOUNTER — Other Ambulatory Visit: Payer: Self-pay | Admitting: Medical Genetics

## 2023-02-20 ENCOUNTER — Other Ambulatory Visit: Payer: Self-pay

## 2023-02-21 ENCOUNTER — Other Ambulatory Visit
Admission: RE | Admit: 2023-02-21 | Discharge: 2023-02-21 | Disposition: A | Discharge status: Home / Self Care | Payer: Self-pay | Source: Ambulatory Visit | Attending: Medical Genetics | Admitting: Medical Genetics

## 2023-03-07 LAB — GENECONNECT MOLECULAR SCREEN: Genetic Analysis Overall Interpretation: NEGATIVE

## 2023-08-16 ENCOUNTER — Ambulatory Visit

## 2023-08-25 ENCOUNTER — Ambulatory Visit

## 2023-11-06 ENCOUNTER — Ambulatory Visit

## 2023-11-06 DIAGNOSIS — Z113 Encounter for screening for infections with a predominantly sexual mode of transmission: Secondary | ICD-10-CM

## 2023-11-06 DIAGNOSIS — B3731 Acute candidiasis of vulva and vagina: Secondary | ICD-10-CM

## 2023-11-06 LAB — WET PREP FOR TRICH, YEAST, CLUE
Clue Cell Exam: NEGATIVE
Trichomonas Exam: NEGATIVE

## 2023-11-06 LAB — HM HIV SCREENING LAB: HM HIV Screening: NEGATIVE

## 2023-11-06 MED ORDER — FLUCONAZOLE 150 MG PO TABS: 150.0000 mg | ORAL_TABLET | Freq: Once | ORAL | Status: AC

## 2023-11-06 MED ORDER — CLOTRIMAZOLE 1 % VA CREA: 1.0000 | TOPICAL_CREAM | Freq: Every day | VAGINAL | Status: AC

## 2023-11-06 NOTE — Progress Notes (Signed)
 [facility] Department STI clinic [address] [place] [address] Main phone: [phone]  STI screening visit  Subjective:  Kristin Hines is a 28 y.o. female being seen today for an STI screening visit. The patient reports they do have symptoms.  Patient reports that they do not desire a pregnancy in the next year. Patient is currently using abstinence to prevent pregnancy. They reported they are not interested in discussing contraception today.    Patient's last menstrual period was 10/24/2023 (exact date).  Patient has the following medical conditions:  Patient Active Problem List   Diagnosis Date Noted   Obesity BMI=30 06/30/2022   PTSD (post-traumatic stress disorder) 07/15/2021   Chief Complaint  Patient presents with   SEXUALLY TRANSMITTED DISEASE   HPI Patient reports vaginal discharge, itching, and irritation that tends to occur mid-cycle. She is not currently sexually active, no sex in around 2 months. No use of douching or hygiene products.   Does the patient using douching products? No  See flowsheet for further details and programmatic requirements Hyperlink available at the top of the signed note in blue.  Flow sheet content below:  Pregnancy Intention Screening Does the patient want to become pregnant in the next year?: No Does the patient's partner want to become pregnant in the next year?: No Would the patient like to discuss contraceptive options today?: No Reason For STD Screen STD Screening: Has symptoms Have you ever had an STD?: Yes History of Antibiotic use in the past 2 weeks?: No STD Symptoms Denies all: No Genital Itching: Yes Lower abdominal pain: No Discharge: Yes Dysuria: Yes Genital ulcer / lesion: No Rash: No Vaginal irritation: Yes Oral / Other skin ulcer: No Pain with sex: No Sore Throat: No Visual Changes: No Vaginal Bleeding: No Risk Factors for Hep B Household, sexual, or needle  sharing contact of a person infected with Hep B: No Sexual contact with a person who uses drugs not as prescribed?: No Currently or Ever used drugs not as prescribed: No HIV Positive: No PRep Patient: No Men who have sex with men: No Have Hepatitis C: No History of Incarceration: No History of Homeslessness?: No Anal sex following anal drug use?: No Risk Factors for Hep C Currently using drugs not as prescribed: No Sexual partner(s) currently using drugs as not prescribed: No History of drug use: No HIV Positive: No People with a history of incarceration: No People born between the years of 33 and 20: No Counseling Patient counseled to use condoms with all sex: Condoms declined RTC in 2-3 weeks for test results: Yes Clinic will call if test results abnormal before test result appt.: Yes Test results given to patient Patient counseled to use condoms with all sex: Condoms declined   Screening for MPX risk:  Unexplained rash?  No   MSM?  No   Multiple or anonymous sex partners?  No   Any close or sexual contact with a person  diagnosed with MPX?  No   Any outside the [place]  where MPX is endemic?  No   High clinical suspicion for MPX?    -Unlikely to be chickenpox    -Lymphadenopathy    -Rash that presents in same phase of       evolution on any given body part  No   Screenings: Last HIV test per patient/review of record was  Lab Results  Component Value Date   HMHIVSCREEN Negative - Validated 07/09/2021    Lab Results  Component Value Date   HIV Non reactive 12/13/2017     Last HEPC test per patient/review of record was No results found for: HMHEPCSCREEN No components found for: HEPC   Last HEPB test per patient/review of record was No components found for: HMHEPBSCREEN   Patient reports last pap was:   Lab Results  Component Value Date   SPECADGYN Comment 06/30/2022   Result Date Procedure Results Follow-ups  06/30/2022 IGP, rfx Aptima HPV ASCU DIAGNOSIS::  Comment Specimen adequacy:: Comment Clinician Provided ICD10: Comment Performed by:: Comment PAP Smear Comment: . Note:: Comment Test Methodology: Comment PAP Reflex: Comment   08/07/2017 HM PAP SMEAR HM Pap smear: Neg     Immunization history:  Immunization History  Administered Date(s) Administered   Tdap 12/13/2017    The following portions of the patient's history were reviewed and updated as appropriate: allergies, current medications, past medical history, past social history, past surgical history and problem list.  Objective:  There were no vitals filed for this visit.  Physical Exam Vitals and nursing note reviewed. Exam conducted with a chaperone present Ilah B).  Constitutional:      Appearance: Normal appearance.  HENT:     Head: Normocephalic and atraumatic.     Mouth/Throat:     Mouth: Mucous membranes are moist.     Pharynx: Oropharynx is clear. No oropharyngeal exudate or posterior oropharyngeal erythema.  Pulmonary:     Effort: Pulmonary effort is normal.  Abdominal:     General: Abdomen is flat.     Palpations: There is no mass.     Tenderness: There is no abdominal tenderness. There is no rebound.  Genitourinary:    General: Normal vulva.     Exam position: Lithotomy position.     Pubic Area: No rash or pubic lice.      Labia:        Right: No rash or lesion.        Left: No rash or lesion.      Vagina: Vaginal discharge present. No erythema, bleeding or lesions.     Cervix: Erythema present. No cervical motion tenderness, discharge, friability or lesion.     Comments: pH = 8-9 External hypopigmentation of labia majora  Lymphadenopathy:     Head:     Right side of head: No preauricular or posterior auricular adenopathy.     Left side of head: No preauricular or posterior auricular adenopathy.     Cervical: No cervical adenopathy.     Upper Body:     Right upper body: No supraclavicular, axillary or epitrochlear adenopathy.     Left upper  body: No supraclavicular, axillary or epitrochlear adenopathy.     Lower Body: No right inguinal adenopathy. No left inguinal adenopathy.  Skin:    General: Skin is warm and dry.     Findings: No rash.  Neurological:     Mental Status: She is alert and oriented to person, place, and time.    Assessment and Plan:  Kristin Hines is a 28 y.o. female presenting to the [facility] Department for STI screening  1. Screening for venereal disease (Primary)  - Chlamydia/Gonorrhea [place] [facility] - WET PREP FOR TRICH, YEAST, CLUE - HIV [place] [facility] - Syphilis Serology,  Lab  2. Vulvovaginal candidiasis  - fluconazole (DIFLUCAN) 150 MG tablet; Take 1 tablet (150 mg total) by mouth once for 1 dose. - clotrimazole (GYNE-LOTRIMIN) 1 % vaginal cream; advised to apply cream to external  portion of vulva where it is hypopigmented/itching. - Hypopigmentation of labia majora mild, skin not thickened. Let her know that if this persists/worsens to see either a general ob/gyn or dermatologist.   Patient accepted the following screenings: vaginal CT/GC swab, vaginal wet prep, HIV, and RPR Patient meets criteria for HepB screening? No. Ordered? no Patient meets criteria for HepC screening? No. Ordered? no  Treat wet prep per standing order Discussed time line for [facility] results and that patient will be called with positive results and encouraged patient to call if she had not heard in 2 weeks.  Counseled to return or seek care for continued or worsening symptoms Recommended repeat testing in 3 months with positive results. Recommended condom use with all sex for STI prevention.   Return if symptoms worsen or fail to improve.  No future appointments.  Damien FORBES Satchel, NP

## 2023-11-06 NOTE — Progress Notes (Signed)
 Pt here for STI screening.  Wet mount results reviewed with patient.  Results positive for few yeast.  Per provider order, Fluconazol 150mg  1 tablet and Clotrimazole 1% Vaginal cream dispensed to patient.  Counseled re medications, side effects, plan of care and when to contact clinic with questions or concerns.  Verbalizes Kipp Pry, RN
